# Patient Record
Sex: Female | Born: 1990 | Race: Black or African American | Hispanic: No | Marital: Single | Smoking: Never smoker
Health system: Southern US, Community
[De-identification: ages and names within clinical notes are randomized; demographics above are authoritative.]

## PROBLEM LIST (undated history)

## (undated) HISTORY — PX: INDUCED ABORTION: SHX677

---
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
## 2010-05-19 LAB — URINALYSIS, ROUTINE W REFLEX MICROSCOPIC
Bilirubin Urine: NEGATIVE
Hgb urine dipstick: NEGATIVE
Specific Gravity, Urine: 1.02 (ref 1.005–1.030)
Urobilinogen, UA: 0.2 mg/dL (ref 0.0–1.0)

## 2010-05-19 LAB — URINE CULTURE

## 2010-05-19 LAB — URINE MICROSCOPIC-ADD ON

## 2013-08-04 ENCOUNTER — Emergency Department: Payer: Self-pay | Admitting: Emergency Medicine

## 2015-08-02 ENCOUNTER — Encounter (HOSPITAL_COMMUNITY): Payer: Self-pay | Admitting: *Deleted

## 2015-08-02 ENCOUNTER — Emergency Department (HOSPITAL_COMMUNITY)
Admission: EM | Admit: 2015-08-02 | Discharge: 2015-08-03 | Disposition: A | Discharge status: Home / Self Care | Payer: Self-pay | Attending: Emergency Medicine | Admitting: Emergency Medicine

## 2015-08-02 DIAGNOSIS — N939 Abnormal uterine and vaginal bleeding, unspecified: Secondary | ICD-10-CM | POA: Insufficient documentation

## 2015-08-02 DIAGNOSIS — Z87828 Personal history of other (healed) physical injury and trauma: Secondary | ICD-10-CM | POA: Insufficient documentation

## 2015-08-02 DIAGNOSIS — Z3202 Encounter for pregnancy test, result negative: Secondary | ICD-10-CM | POA: Insufficient documentation

## 2015-08-02 DIAGNOSIS — R109 Unspecified abdominal pain: Secondary | ICD-10-CM

## 2015-08-02 LAB — BASIC METABOLIC PANEL
ANION GAP: 7 (ref 5–15)
BUN: 8 mg/dL (ref 6–20)
CO2: 25 mmol/L (ref 22–32)
Calcium: 9.3 mg/dL (ref 8.9–10.3)
Chloride: 104 mmol/L (ref 101–111)
Creatinine, Ser: 0.95 mg/dL (ref 0.44–1.00)
GFR calc Af Amer: >60 mL/min (ref >60)
Glucose, Bld: 118 mg/dL — ABNORMAL HIGH (ref 65–99)
POTASSIUM: 3.4 mmol/L — AB (ref 3.5–5.1)
SODIUM: 136 mmol/L (ref 135–145)

## 2015-08-02 LAB — CBC WITH DIFFERENTIAL/PLATELET
BASOS ABS: 0 10*3/uL (ref 0.0–0.1)
Basophils Relative: 0 %
EOS ABS: 0.1 10*3/uL (ref 0.0–0.7)
EOS PCT: 2 %
HCT: 38.3 % (ref 36.0–46.0)
HEMOGLOBIN: 12.3 g/dL (ref 12.0–15.0)
LYMPHS PCT: 35 %
Lymphs Abs: 1.9 10*3/uL (ref 0.7–4.0)
MCH: 27.9 pg (ref 26.0–34.0)
MCHC: 32.1 g/dL (ref 30.0–36.0)
MCV: 86.8 fL (ref 78.0–100.0)
Monocytes Absolute: 0.4 10*3/uL (ref 0.1–1.0)
Monocytes Relative: 8 %
NEUTROS PCT: 55 %
Neutro Abs: 3 10*3/uL (ref 1.7–7.7)
PLATELETS: 354 10*3/uL (ref 150–400)
RBC: 4.41 MIL/uL (ref 3.87–5.11)
RDW: 12.5 % (ref 11.5–15.5)
WBC: 5.5 10*3/uL (ref 4.0–10.5)

## 2015-08-02 LAB — I-STAT BETA HCG BLOOD, ED (MC, WL, AP ONLY)

## 2015-08-02 NOTE — ED Notes (Signed)
Patient presents stating had normal period 5/11-5/15 and then started again 5/25-5/30  Denies abd cramping at this time

## 2015-08-03 LAB — URINALYSIS, ROUTINE W REFLEX MICROSCOPIC
Bilirubin Urine: NEGATIVE
Glucose, UA: NEGATIVE mg/dL
HGB URINE DIPSTICK: NEGATIVE
Ketones, ur: NEGATIVE mg/dL
LEUKOCYTES UA: NEGATIVE
Nitrite: NEGATIVE
PROTEIN: NEGATIVE mg/dL
SPECIFIC GRAVITY, URINE: 1.011 (ref 1.005–1.030)
pH: 7 (ref 5.0–8.0)

## 2015-08-03 NOTE — Discharge Instructions (Signed)
Your labs today are reassuring.  You may take 600 mg ibuprofen for your abdominal pain.  Please call and follow up with [facility].  Please also follow up with your primary care doctor.  Return to the ED if you experience severe vaginal bleeding, abnormal vaginal discharge, or any new worsening symptoms.    Abnormal Uterine Bleeding Abnormal uterine bleeding can affect women at various stages in life, including teenagers, women in their reproductive years, pregnant women, and women who have reached menopause. Several kinds of uterine bleeding are considered abnormal, including:  Bleeding or spotting between periods.   Bleeding after sexual intercourse.   Bleeding that is heavier or more than normal.   Periods that last longer than usual.  Bleeding after menopause.  Many cases of abnormal uterine bleeding are minor and simple to treat, while others are more serious. Any type of abnormal bleeding should be evaluated by your health care provider. Treatment will depend on the cause of the bleeding. HOME CARE INSTRUCTIONS Monitor your condition for any changes. The following actions may help to alleviate any discomfort you are experiencing:  Avoid the use of tampons and douches as directed by your health care provider.  Change your pads frequently. You should get regular pelvic exams and Pap tests. Keep all follow-up appointments for diagnostic tests as directed by your health care provider.  SEEK MEDICAL CARE IF:   Your bleeding lasts more than 1 week.   You feel dizzy at times.  SEEK IMMEDIATE MEDICAL CARE IF:   You pass out.   You are changing pads every 15 to 30 minutes.   You have abdominal pain.  You have a fever.   You become sweaty or weak.   You are passing large blood clots from the vagina.   You start to feel nauseous and vomit. MAKE SURE YOU:   Understand these instructions.  Will watch your condition.  Will get help right away if you are not  doing well or get worse.   This information is not intended to replace advice given to you by your health care provider. Make sure you discuss any questions you have with your health care provider.   Document Released: 02/18/2005 Document Revised: 02/23/2013 Document Reviewed: 09/17/2012 Elsevier Interactive Patient Education [employer]2016 Elsevier Inc.

## 2015-08-03 NOTE — ED Provider Notes (Signed)
CSN: [id number]     Arrival date & time 08/02/15  2213 History   First MD Initiated Contact with Patient 08/03/15 0000     Chief Complaint  Patient presents with  . Abdominal Cramping     (Consider location/radiation/quality/duration/timing/severity/associated sxs/prior Treatment) HPI Tami Wells is a G43P0A1 25 y.o. female with no significant PMH who presents with now resolved sudden onset, moderate suprapubic abdominal cramping followed by vaginal bleeding.  LNMP 07/13/15--07/17/15.  07/25/15 (1 week ago) patient reports that she jumped down into a hole at work and hit her lower abdomen that was then followed by vaginal spotting that turned into what seemed like another menstrual cycle.  Bleeding ended yesterday.  She still reports mild suprapubic abdominal pain.  She did not take anything for her symptoms.  Denies vaginal discharge, current vaginal bleeding, urinary symptoms, N/V/D.  She states her menstrual cycle is very regular.  She does report she went for her annual physical 07/25/15 and a pap smear was performed.  Per chart review, patient had negative HIV, RPR, trich, gonorrhea, and chlamydia.  She reports she has not been sexually active since her annual physical.   History reviewed. No pertinent past medical history. Past Surgical History  Procedure Laterality Date  . Induced abortion     No family history on file. Social History  Substance Use Topics  . Smoking status: Never Smoker   . Smokeless tobacco: Never Used  . Alcohol Use: Yes     Comment: ocassionally   OB History    No data available     Review of Systems All other systems negative unless otherwise stated in HPI    Allergies  Review of patient's allergies indicates no known allergies.  Home Medications   Prior to Admission medications   Not on File   BP 123/75 mmHg  Pulse 72  Temp(Src) 98.1 F (36.7 C) (Oral)  Resp 18  Ht 5' (1.524 m)  Wt 74.844 kg  BMI 32.22 kg/m2  SpO2 100%  LMP  07/27/2015 Physical Exam  Constitutional: She is oriented to person, place, and time. She appears well-developed and well-nourished.  Non-toxic appearance. She does not have a sickly appearance. She does not appear ill.  HENT:  Head: Normocephalic and atraumatic.  Mouth/Throat: Oropharynx is clear and moist.  Eyes: Conjunctivae are normal.  Neck: Normal range of motion. Neck supple.  Cardiovascular: Normal rate and regular rhythm.   Pulmonary/Chest: Effort normal and breath sounds normal. No accessory muscle usage or stridor. No respiratory distress. She has no wheezes. She has no rhonchi. She has no rales.  Abdominal: Soft. Bowel sounds are normal. She exhibits no distension. There is no tenderness. There is no rebound and no guarding.  Genitourinary: Vagina normal and uterus normal. There is no tenderness or lesion on the right labia. There is no tenderness or lesion on the left labia. Cervix exhibits no motion tenderness and no discharge. Right adnexum displays no mass and no tenderness. Left adnexum displays no mass and no tenderness.  Musculoskeletal: Normal range of motion.  Lymphadenopathy:    She has no cervical adenopathy.  Neurological: She is alert and oriented to person, place, and time.  Speech clear without dysarthria.  Skin: Skin is warm and dry.  Psychiatric: She has a normal mood and affect. Her behavior is normal.    ED Course  Procedures (including critical care time) Labs Review Labs Reviewed  BASIC METABOLIC PANEL - Abnormal; Notable for the following:    Potassium 3.4 (*)  Glucose, Bld 118 (*)    All other components within normal limits  CBC WITH DIFFERENTIAL/PLATELET  URINALYSIS, ROUTINE W REFLEX MICROSCOPIC (NOT AT [facility])  I-STAT BETA HCG BLOOD, ED ([facility], [facility], [facility] ONLY)    Imaging Review No results found. I have personally reviewed and evaluated these images and lab results as part of my medical decision-making.   EKG Interpretation None      MDM    Final diagnoses:  Vaginal bleeding  Abdominal cramping   Patient presents with now resolved suprapubic abdominal cramping and vaginal bleeding.  VSS, NAD.  On exam, patient appears well, non-toxic or septic appearing.  Heart RRR, lungs CTAB, abdomen soft and benign.  Cervix without discharge.  No CMT or adnexal tenderness.  Labs reassuring.  Recent negative HIV, RPR, GC/Chlamydia; therefore, not obtained today.  Doubt ectopic, PID, TOA, torsion, or cervicitis. Plan to follow up with PCP.  [facility].  Discussed return precautions.  Patient agrees and acknowledges the above plan for discharge.   Case has been discussed with Dr. Patria Maneampos who agrees with the above plan for discharge.      Cheri FowlerKayla Kavari Parrillo, PA-C 08/03/15 0123  Azalia BilisKevin Campos, MD 08/03/15 0130

## 2015-08-03 NOTE — ED Notes (Signed)
Pelvic cart at the bedside 
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
## 2023-03-05 NOTE — L&D Delivery Note (Signed)
-------------------------------------------------------------------------------   Attestation signed by Eleanor JULIANNA Poisson, MD at 12/03/23 [phone] I have reviewed the delivery note and confirm it is accurate. I was immediately available but not called to assist with delivery. Eleanor Poisson, MD  Obstetrics & Gynecology [facility]       -------------------------------------------------------------------------------  [facility] Delivery Note: Vaginal   Review the Delivery Report for details. <redacted file path>   Tami Wells is a 32 y.o., now G2P1011 with an estimated date of delivery of 12/05/23, who delivered at [redacted]w[redacted]d gestation.      Membrane rupture occurred at 8:58 PM on 12/02/2023, and the amniotic fluid was clear.  With combined spinal epidural anesthesia, the patient's labor was induced with OXYTOCIN;AROM     Delivery Procedure   Labor progressed, the fetal head descended, and was carefully delivered from a occiput anterior position. With additional maternal expulsive effort, the shoulders and remainder of the fetus were atraumatically delivered.  A controlled vaginal delivery was completed at 5:01 AM on 12/03/2023. The baby was a living, 3.48 kg (7 lb 10.8 oz), female infant, with APGAR scores of 8 at 1 min / 9 at 5 min, delivered by Vaginal, Spontaneous from an occiput anterior position. 1loop(s) of nuchal cord was identified (likely present at the time of admission) and found to be loose around the neck and was reduced.     After an appropriate delay, the cord was doubly clamped and cut. The umbilical cord was found to have 3 vessels. Skin to Skin was initiated at 5:02 AM. The placenta was spontaneousand appeared intact. Pitocin was given immediately after delivery. Fundal massage was performed, and the fundus was firm.  Episiotomy was not done. Perineum, vagina, labia, and cervix were inspected and found to have:  Perineal laceration 1st . Repaired  No.   Hemostasis was confirmed on final inspection. Final sponge, needle, and instrument counts were reported and correct.   Delivery Complications: None    Total estimated blood loss (mL): 100 Total quantitative blood loss (mL):    The patient tolerated the delivery well and is in good and stable condition.  Nanetta App SNM assisted with delivery; Maura Macadam CNM present at delivery.  Notified Dr Poisson of delivery.  Electronically Signed: Maura Macadam, CNM 12/03/2023 / 6:57 AM

## 2023-12-02 NOTE — Progress Notes (Signed)
 IUP @ 39.[redacted] weeks gestation IOL Re: BMI.40 SVE: 6.5/75/v-1 by RN; patient declines AROM at this time UC's q 2-4 min that palpate moderate G2 P0010  GBS positive, Amp x 2 doses Cat I FHR tracing Maternal fetal/status stable Updated Dr Gretel

## 2023-12-02 NOTE — H&P (Signed)
 ------------------------------------------------------------------------------- Attestation signed by Tami Mayotte, MD at 12/02/23 [phone] I agree with documentation below. Patient is [redacted]w[redacted]d by 10-week ultrasound. Pregnancy has been complicated by maternal obesity, prediabetes, uterine fibroids. Patient failed 1-hour glucose challenge and did not complete a 3-hour test. She has followed with Nutrition. Plan to check POC glucose intrapartum to ensure at goal. GBS is positive, and ampicillin is ordered for prophylaxis. Fetal heart tracing is category 1. Cervix is favorable. Proceed with pitocin augmentation.   Tami Mayotte, MD  -------------------------------------------------------------------------------  OBSTETRICS HISTORY AND PHYSICAL  CHIEF COMPLAINT: Induction of labor  DIAGNOSIS: 1. IUP at [redacted]w[redacted]d 2. BMI 49 3. Prediabetes 4. GBS + 5. Uterine Fibroids   ASSESSMENT/PLAN   33 y.o. G2P0010 at [redacted]w[redacted]d dated by LMP c/w 10 week US  with a pregnancy complicated by:  #. mIOL - SVE 4/50/-3 by RN - Membranes intact - Will start induction with pitocin as needed and AROM when appropriate - Vertex confirmed by SVE - EFW 6.5lbs by Leopold's; last US  EFW: 2707g (5#15) 46% AC 47% on 9/8 @ 36 wks - GBS positive, for Ampicillin - R/B/A of vaginal and cesarean delivery explained including bleeding, vaginal lacerations, infection, and damage to surrounding structures such as bladder and perineum. Patient verbalized understanding and all questions answered. Consents signed.  #. BMI 49 - Followed by MFM - Last fetal growth as above  #. Prediabetes - Followed by nutrition - Abnormal 1 hr gtt/ no 3 hr gtt - Checking FSBS at home in lieu of 3 hr gtt - FSBS Q4 hr in labor  #. Uterine Fibroids - Fundal pedunculated 1.4x1.4x1.9cm - Fundal Intramural 1.0 x 0.9 x 1.1  #. Fetal Wellbeing  - Fetal tracing: Category 1 - BMZ: N/a - NICU consult: N/a  #. Routine Obstetrics - Labs reviewed, as below - MBT:  B - Vaccines: see problem list - DVT PPx: ambulatory  #. Postpartum Planning: - Infant feeding: discuss PP  - Contraception: discuss PP   Dispo: admit to L&D - Delivery Plan: anticipate SVD -Collaborate with MD PRN - MD Wells aware of admission and POC  Induction augmentation plan: Medically indicated induction of labor with Artificial rupture of membranes, Misoprostol (Cytotec) cervical ripening, and Oxytocin (Pitocin)  Diagnoses present on admission include: Group B Strep positive  Fetal Presentation / Lie: Cephalic/Vertex determined by digital vaginal examination.  Fetal complications include: No fetal complications present on admission.  Plan: Monitor fetal well-being and intervene as clinically indicated  Maternal comorbidities and complicating characteristics include:  Cardiovascular: No maternal blood pressure or cardiovascular diagnosis present on admission.  Plan: Monitor and treat as indicated.  Endocrine: Prediabetes present on admission.  Plan: Monitor and treat as indicated.  Hematologic: No maternal hematologic / blood diagnosis present on admission.  Plan: Monitor and treat as indicated.  Hepatic / Gastrointestinal:  No hepatic or gastrointestinal diagnosis present on admission.  Plan: Monitor and treat as indicated.  Infectious Disease: No maternal infectious disease diagnosis present on admission.  Plan: Monitor and treat as indicated.  Behavioral Health: No maternal behavioral health diagnosis present on admission.  Plan: Monitor and treat as indicated.  Additional Maternal complicating characteristics: Obesity (BMI 35 or higher) present on admission. Plan: Monitor and address as appropriate.     HISTORY OF PRESENT ILLNESS   33 y.o. G23P0010 female at [redacted]w[redacted]d dated by LMP c/w 10 week scan who presents for induction of labor.  OB Review of Systems: Fetal movement: active Vaginal bleeding: no Loss of fluid: no Contractions: no  Preeclampsia  Symptoms Review: Headaches: no Vision changes:no Chest pain and/or shortness of breath: no  RUQ pain: no  ROS otherwise negative.   HISTORY: OB History  Gravida Para Term Preterm AB Living  2    1   SAB IAB Ectopic Multiple Live Births  0 1       # Outcome Date GA Lbr Len/2nd Weight Sex Type Anes PTL Lv  2 Current           1 IAB              History reviewed. No pertinent past medical history.   Past Surgical History:  Procedure Laterality Date  . No past surgeries       Short Social History[1]   Family History  Problem Relation Age of Onset  . No Known Problems Paternal Grandfather   . Hypertension Paternal Grandmother   . No Known Problems Maternal Grandmother   . No Known Problems Maternal Grandfather   . Hypertension Father   . Diabetes Father   . Liver disease Father        dialysis  . No Known Problems Mother   . No Known Problems Brother   . No Known Problems Sister   . Diabetes Maternal Aunt   . Hypertension Paternal Aunt   . Breast cancer Neg Hx   . Colon cancer Neg Hx   . Ovarian cancer Neg Hx   . Stroke Neg Hx   . Thalassemia Neg Hx   . Sickle cell anemia Neg Hx   . Thrombophilia Neg Hx   . Hemophilia Neg Hx   . Miscarriages / Stillbirths Neg Hx   . Pre-eclampsia Neg Hx   . Preterm labor Neg Hx   . Down syndrome Neg Hx   . Congenital heart disease Neg Hx   . Cystic fibrosis Neg Hx   . Spina bifida Neg Hx   . Autism Neg Hx   . Anencephaly Neg Hx   . Muscular dystrophy Neg Hx   . PKU Neg Hx   . Canavan disease Neg Hx   . Chromosomal disorder Neg Hx   . Familial dysautonomia Neg Hx   . Fragile X syndrome Neg Hx   . Huntington's disease Neg Hx   . Intellectual Disability  Neg Hx   . Birth defects Neg Hx   . Meningomyelocele Neg Hx       OBJECTIVE  VITALS: Temp:  [98 F (36.7 C)] 98 F (36.7 C) Heart Rate:  [85-99] 85 Resp:  [16] 16 BP: (128)/(70-80) 128/80 SpO2:  [95 %-99 %] 99 %  PHYSICAL EXAM: GENERAL: NAD,  alert CHEST: no increased work of breathing ABDOMEN: gravid, nontender, EFW 6.5lbs by Leopolds EXTREMITIES: WWP, nonedematous NEURO: AAO x 3 CERVIX: 4/50/-3 by RN SPECULUM: Not indicated  FHT:               MEDS: Current Home Medications  Medication Sig Last Dose  ACCU-CHEK SOFTCLIX LANCETS lancets Use 4 daily as instructed Past Month  aspirin (ECOTRIN LOW DOSE) EC tablet Take one tablet (81 mg dose) by mouth daily. Past Month  Blood Glucose Monitoring Suppl (ACCU-CHEK GUIDE) w/Device KIT Use as directed by physician to monitor blood sugars Past Month  glucose blood test strip Use to accu-chek guide test strips to monitor blood glucose 4 times daily Past Month  Prenatal Vit-Fe Fumarate-FA (PRENATAL VITAMIN PLUS LOW IRON) 27-1 MG TABS Take 1 tablet by mouth daily. Take 1 tablet daily. May take with  food if upset stomach. 12/01/2023    Allergies[2]   DIAGNOSTIC STUDIES: No results for input(s): WBC, HGB, HCT, PLT, NA, K, CL, CO2, BUN, CREATININE, GLUCOSE, CALCIUM, AST, ALT, UA in the last 168 hours.  Invalid input(s): LABALB, MG  Recent Results (from the past 24 hours)  POCT UA VISUAL 2 OB   Collection Time: 12/01/23 11:05 AM  Result Value Ref Range   Protein Trace (A) Negative mg/dL   Glucose,Urine Negative Negative mg/dL    Prenatal Labs: ABO Grouping (no units)  Date Value  05/26/2023 B   Rh Factor (no units)  Date Value  05/26/2023 Positive   Antibody Screen (no units)  Date Value  05/26/2023 Negative        Hemoglobin (g/dL)  Date Value  93/80/7974 12.6  05/26/2023 12.9   HGB (g/dL)  Date Value  93/97/7976 12.0 (A)   Hematocrit (%)  Date Value  08/21/2023 39.7  05/26/2023 38.5  08/03/2021 36.1   Protein (mg/dL)  Date Value  90/70/7974 Trace (A)  07/22/2023 Negative        Strep Gp B NAA (no units)  Date Value  11/10/2023 Positive (A)          Neisseria Gonorrhoeae, NAA (no units)  Date Value   05/26/2023 Negative   Neisseria Gonorrhoeae By PCR (no units)  Date Value  08/03/2021 Negative  08/06/2016 Negative   Chlamydia Trachomatis, NAA (no units)  Date Value  05/26/2023 Negative  08/03/2021 Negative  08/06/2016 Negative   RPR (no units)  Date Value  08/21/2023 Non Reactive  05/26/2023 Non Reactive  08/03/2021 Non Reactive        Rubella Ab, IgG (index)  Date Value  05/26/2023 2.76     Hepatitis B Surface Ag (no units)  Date Value  05/26/2023 Negative   HIV Ab/p24 Ag Screen (no units)  Date Value  08/21/2023 Non Reactive    No results found for: PPD TSH (uIU/mL)  Date Value  08/03/2021 2.240   HM Pap smear (no units)  Date Value  10/02/2022 Abnormal (A)        No results found for: CF No results found for: CANAVAN No components found for: BETATHALASSEMIAHB  No results found for: SICKLE  No results found for: [employer]. Rolene, CNM Obstetrics & Gynecology [facility] 12/02/2023 6:44 AM         [1] Social History Tobacco Use  . Smoking status: Never    Passive exposure: Never  . Smokeless tobacco: Never  Vaping Use  . Vaping status: Never Used  Substance Use Topics  . Alcohol use: Not Currently    Comment: 3 glasses of wine per month//none in pregnancy  . Drug use: No  [2] No Known Allergies

## 2023-12-02 NOTE — Progress Notes (Signed)
 IUP @ 39.[redacted] weeks gestation IOL Re: BMI.40 SVE: 6.5/80/v-1  AROM with clear fluid noted and no meconium  UC's q 2-4 min that palpate moderate Discussed pain management options at this time G2 P0010  GBS positive, Amp x 2 doses Cat I FHR tracing Maternal fetal/status stable Updated Dr Gretel

## 2023-12-02 NOTE — Progress Notes (Signed)
 OB Progress Note  Care assumed at 0700, patient met with CNM on morning rounds.  Tami Wells is a 33 y.o. G2P0010 at [redacted]w[redacted]d admitted for induction of labor. Pregnancy complicated by obesity, uterine fibroids, GBS positive, and treating as A1GDM. Of note, patient declines blood transfusion. States that she declines all blood products, even in a life threatening situation. Has band and will sign forms.   Fibroids noted on D&V ultrasound 05/09/23 1. Fundal Pedunculated: 1.4 x 1.4 x 1.9cm  2. Fundal Intramural: 1.0 x 0.9 x 1.1cm   SCE 4/50/-3 on admit FHT cat I Plan induction/augmentation with pitocin, and ampicillin for GBS positive status  Continue to monitor POC glucose per protocol  Continue to monitor closely.   Eleanor Poisson, MD  Obstetrics & Gynecology [facility]

## 2023-12-03 NOTE — Lactation Note (Signed)
(  [id number])--Un room for initial contact. Baby was born this morning at 0501 and he nursed in LDR. Mom getting to offer the breast when I arrived at the bedside. Mom offering the R  breast in a cradle hold but it seemed awkward for her and baby. I recommended a clutch hold and mom receptive. I placed pillow support for a clutch hold and assisted mom to offer the R breast. (+) colostrum noted. Baby not interested in latching, so mom placed him skin to skin. After a few minutes, he began rooting again and mom offered the R breast in a clutch hold. I showed mom how to sandwich the breast, support baby's head and bring him in to the breast quickly for a deep initial latch. I encouraged not to pull the breast away from baby's nose to minimize nipple tenderness. Discussed typical newborn feeding patterns and encouraged parents to nap today when they can. Discussed avoiding bottles/pacifiers for the first 3-4 weeks of life as ideal. Encouraged to call for [facility] support prn. Baby still nursing when I left the room. Updated their RN, Jearld.

## 2023-12-03 NOTE — Progress Notes (Signed)
 OB Progress Note  Feeling some discomfort with contractions but overall comfortable with epidural.  SCE 10/100/+1 Pitocin at 12 mu/min FHT cat I, toco every 2 minutes   Will start pushing after straight catheterization to empty bladder Tami Poisson, MD  Obstetrics & Gynecology [facility]

## 2023-12-04 NOTE — Lactation Note (Signed)
 [id number]  Follow up lactation consult at bedside. Mom reports recent feeding at breast and then supplemented with formula via bottle before baby went for circumcision.  LC discussed supply and demand at breast and encouraged delay of artificial nipples. Mom reports she is planning to pump and bottle feed at home. LC assisted mom with set up of personal spectra  pump with few drops expressed.   Lc encouraged pumping 8+x/day and mom is aware of lactation resources as needed.

## 2023-12-04 NOTE — Discharge Summary (Signed)
 [facility]   Obstetrics Discharge Note  Discharge Details   Admit date:         12/02/2023 Discharge date:         12/04/2023  Hospital Days:    2 days  Active Hospital Problems   Diagnosis Date Noted POA  . *Encounter for induction of labor 12/02/2023 Not Applicable  . Single live birth (*) 12/03/2023 Not Applicable    Resolved Hospital Problems  No resolved problems to display.      Current Discharge Medication List    You have not been prescribed any medications.     Assessment/Plan   Active Hospital Problems   Diagnosis Date Noted POA  . *Encounter for induction of labor 12/02/2023 Not Applicable  . Single live birth (*) 12/03/2023 Not Applicable    Resolved Hospital Problems  No resolved problems to display.    Physicians involved in care during this hospitalization Attending Provider: Rosealee Mayotte, MD Attending Provider: Eleanor JULIANNA Poisson, MD Attending Provider: Dena JONELLE Guadeloupe, MD Admitting Provider: Rosealee Mayotte, MD Anesthesiologist: Belvie JINNY Breen, MD Bedside Procedures   No orders found       Condition at Discharge: Stable  S: Lynleigh is doing well. She reports pain is well-controlled with PO meds and bleeding is light without clots. She is breastfeeding and states it is going well. She is able to ambulate independently to the bathroom and voids without difficulty. She reports having a BM without difficulty. She denies lightheadedness/dizziness, HA, vision changes, RUQ pain, chest pain, dyspnea, or pain/swelling in the back of either calf. She states that emotionally she is feeling well and happy. She desires discharge to home today. She plans to use nothing for PP contraception.   O: See flowsheet  A/P: # 33 y.o., [redacted]w[redacted]d s/p SVD PP day 1 - Reassuring maternal status, pain well-controlled, VSS  - Continue standard postpartum orders. Encouraged regular ambulation, good nutrition, and hydration  - PP Hgb 12.0 - EPDS 0/30, 0 on #10   - Rx sent for ibuprofen - Discharge instructions reviewed with pt including warning S/S to report to provider. Nothing per vaginal for 6 wks. Pt educated to go to ED if she develops SI/HI. - F/u 1 wk for BP check, sooner PRN    # Prenatal labs - Rh positive - Rubella immune - Varicella unknown   # GHTN - Intermittent mild range BP's following admission to L&D - Asymptomatic, labs WNL - Discussed warning S/S of preeclampsia to report to pt - Referral placed for RPM program - Recommended 1 wk BP check   # BMI 49 - Encouraged regular ambulation - F/u PRN    # Fibroid - F/u PRN   # Prediabetes - Encourage low carb diet - F/u PRN   # Lactating - Lactation services PRN - Reviewed warning S/S of complications to report to provider    - Dispo: Discharge to home    Contact information for follow-up     [facility]   [address] [place] [address]  Phone: [phone]     Next Steps: Follow up in 1 week(s)   Instructions: Follow up in 1 week for BP check, sooner PRN      Appointments which have been scheduled    Dec 05, 2023 11:30 AM Routine Prenatal Visit with Darryle Lucien Hinds, MD [facility]) (--) [address] [place] [address] [phone]   Dec 08, 2023 9:30 AM Non-stress test  with WC OBGYN NST FRONT [facility]) (--) [address] [place] [address] [phone]   Dec 08, 2023 10:00 AM Ultrasound OB Pelvic with WC OBGYN SONO 2 [facility]) (--) [address] [place]. [place] [address] [phone]   Dec 08, 2023 10:30 AM Routine Prenatal Visit with Fairy KANDICE Fire, MD [facility]) (--) [address] [place] [address] [phone]        Interval History  History of Present Illness Tami Wells is a 33 y.o. G2P1011 who was admitted for IOL. She is a  Teacher, adult education patient with adequate PNC notable for fibroid, prediabetes, BMI >40, and elevated 1hr GTT. She was GBS positive. She did meet criteria for [facility] following admission to L&D with intermittent mild range BP's with no symptoms and normal labs. She had a NSVD with a first degree perineal laceration and an EBL of . Her PP course has been remarkable for GHTN. She has met all her PP milestones. Pt is stable for discharge.   MBT: B+ RI Hgb: 12.0 BC: None   Medications:  . docusate sodium  100 mg Oral BID  . ibuprofen  600 mg Oral Q6H  . prenatal multivitamin  1 tablet Oral Daily    Physical Examination  Temp:  [97.5 F (36.4 C)-98.3 F (36.8 C)] 97.5 F (36.4 C) Heart Rate:  [70-92] 70 Resp:  [18-19] 18 BP: (122-147)/(80-92) 138/84 SpO2:  [99 %] 99 %      No intake or output data in the 24 hours ending 12/04/23 0700  Physical Exam Vitals and nursing note reviewed.  Constitutional:      General: She is not in acute distress.    Appearance: Normal appearance. She is normal weight.  HENT:     Head: Normocephalic and atraumatic.     Nose: Nose normal.     Mouth/Throat:     Mouth: Mucous membranes are moist.  Pulmonary:     Effort: Pulmonary effort is normal. No respiratory distress.  Abdominal:     General: There is no distension.     Palpations: Abdomen is soft.     Comments: FF @ -1 below U  Genitourinary:    Comments: Lochia scant Laceration well-approximated Vulvar edema absent Skin:    General: Skin is warm and dry.  Neurological:     General: No focal deficit present.     Mental Status: She is alert and oriented to person, place, and time.     Deep Tendon Reflexes: Reflexes normal.     Comments: Negative clonus   Psychiatric:        Mood and Affect: Mood normal.        Behavior: Behavior normal.        Thought Content: Thought content normal.        Judgment: Judgment normal.    Delivery: Date of Birth: 12/03/2023  Time of Birth: 5:01 AM    Information for the patient's newborn:  Kortny, Lirette [[id number]]  female Birth History  . Birth    Weight: 3.48 kg (7 lb 10.8 oz)  . Apgar    One: 8    Five: 9  . Delivery Method: Vaginal, Spontaneous  . Gestation Age: 71 5/7 wks  . Duration of Labor: 1st: 11h 37m / 2nd: 1h 2m  . Hospital Name: [facility]  . Hospital Location: [place], [place]    Results  Labs:  Recent Results (from the past 24  hours)  CBC   Collection Time: 12/04/23  8:04 AM  Result Value Ref Range   WBC 10.2 4.0 - 10.5 thou/mcL   RBC 4.36 3.93 - 5.22 million/mcL   HGB 12.0 11.2 - 15.7 gm/dL   HCT 62.2 65.8 - 55.0 %   MCV 86.5 79.4 - 94.8 fL   MCH 27.5 25.6 - 32.2 pg   MCHC 31.8 (L) 32.2 - 35.5 gm/dL   Plt Ct 775 849 - 599 thou/mcL   RDW SD 44.3 35.1 - 46.3 fL   MPV 10.6 9.4 - 12.4 fL   NRBC% 0.0 0.0 - 0.2 /100WBC   Absolute NRBC Count 0.00 0.00 - 0.01 thou/mcL   Unresulted Labs     Order Current Status   Comprehensive Metabolic Panel In process       Electronically signed: Lionel Hess, CNM 12/04/2023 / 8:33 AM

## 2023-12-07 ENCOUNTER — Other Ambulatory Visit: Payer: Self-pay

## 2023-12-07 ENCOUNTER — Emergency Department (HOSPITAL_COMMUNITY)

## 2023-12-07 ENCOUNTER — Encounter (HOSPITAL_COMMUNITY): Payer: Self-pay

## 2023-12-07 ENCOUNTER — Emergency Department (HOSPITAL_COMMUNITY)
Admission: EM | Admit: 2023-12-07 | Discharge: 2023-12-07 | Disposition: A | Discharge status: Home / Self Care | Attending: Emergency Medicine | Admitting: Emergency Medicine

## 2023-12-07 DIAGNOSIS — R339 Retention of urine, unspecified: Secondary | ICD-10-CM | POA: Insufficient documentation

## 2023-12-07 DIAGNOSIS — M79662 Pain in left lower leg: Secondary | ICD-10-CM | POA: Insufficient documentation

## 2023-12-07 DIAGNOSIS — R600 Localized edema: Secondary | ICD-10-CM | POA: Diagnosis not present

## 2023-12-07 LAB — COMPREHENSIVE METABOLIC PANEL WITH GFR
ALT: 21 U/L (ref 0–44)
AST: 31 U/L (ref 15–41)
Albumin: 3.3 g/dL — ABNORMAL LOW (ref 3.5–5.0)
Alkaline Phosphatase: 145 U/L — ABNORMAL HIGH (ref 38–126)
Anion gap: 11 (ref 5–15)
BUN: 7 mg/dL (ref 6–20)
CO2: 21 mmol/L — ABNORMAL LOW (ref 22–32)
Calcium: 8.7 mg/dL — ABNORMAL LOW (ref 8.9–10.3)
Chloride: 109 mmol/L (ref 98–111)
Creatinine, Ser: 0.7 mg/dL (ref 0.44–1.00)
GFR, Estimated: >60 mL/min (ref >60)
Glucose, Bld: 97 mg/dL (ref 70–99)
Potassium: 3.8 mmol/L (ref 3.5–5.1)
Sodium: 140 mmol/L (ref 135–145)
Total Bilirubin: 0.4 mg/dL (ref 0.0–1.2)
Total Protein: 6.1 g/dL — ABNORMAL LOW (ref 6.5–8.1)

## 2023-12-07 LAB — PROTEIN / CREATININE RATIO, URINE
Creatinine, Urine: 232 mg/dL
Protein Creatinine Ratio: 0.12 mg/mg{creat} (ref 0.00–0.15)
Total Protein, Urine: 28 mg/dL

## 2023-12-07 LAB — URINALYSIS, ROUTINE W REFLEX MICROSCOPIC
Bacteria, UA: NONE SEEN
Bilirubin Urine: NEGATIVE
Glucose, UA: NEGATIVE mg/dL
Hgb urine dipstick: NEGATIVE
Ketones, ur: NEGATIVE mg/dL
Leukocytes,Ua: NEGATIVE
Nitrite: NEGATIVE
Protein, ur: 30 mg/dL — AB
Specific Gravity, Urine: 1.02 (ref 1.005–1.030)
pH: 5 (ref 5.0–8.0)

## 2023-12-07 LAB — CBC WITH DIFFERENTIAL/PLATELET
Abs Immature Granulocytes: 0.1 K/uL — ABNORMAL HIGH (ref 0.00–0.07)
Basophils Absolute: 0.1 K/uL (ref 0.0–0.1)
Basophils Relative: 1 %
Eosinophils Absolute: 0.3 K/uL (ref 0.0–0.5)
Eosinophils Relative: 5 %
HCT: 39.8 % (ref 36.0–46.0)
Hemoglobin: 12.6 g/dL (ref 12.0–15.0)
Immature Granulocytes: 2 %
Lymphocytes Relative: 28 %
Lymphs Abs: 1.8 K/uL (ref 0.7–4.0)
MCH: 27.9 pg (ref 26.0–34.0)
MCHC: 31.7 g/dL (ref 30.0–36.0)
MCV: 88.1 fL (ref 80.0–100.0)
Monocytes Absolute: 0.5 K/uL (ref 0.1–1.0)
Monocytes Relative: 8 %
Neutro Abs: 3.6 K/uL (ref 1.7–7.7)
Neutrophils Relative %: 56 %
Platelets: 262 K/uL (ref 150–400)
RBC: 4.52 MIL/uL (ref 3.87–5.11)
RDW: 13.8 % (ref 11.5–15.5)
WBC: 6.4 K/uL (ref 4.0–10.5)
nRBC: 0 % (ref 0.0–0.2)

## 2023-12-07 MED ORDER — POTASSIUM CHLORIDE CRYS ER 20 MEQ PO TBCR: 20.0000 meq | EXTENDED_RELEASE_TABLET | Freq: Every day | ORAL | 0 refills | Status: AC

## 2023-12-07 MED ORDER — FUROSEMIDE 20 MG PO TABS
20.0000 mg | ORAL_TABLET | Freq: Every day | ORAL | 0 refills | Status: AC
Start: 2023-12-07 — End: 2023-12-12

## 2023-12-07 NOTE — Progress Notes (Signed)
 BLE venous duplex has been completed.  Preliminary results given to Caroline Aberman, PA-C.   Results can be found under chart review under CV PROC. 12/07/2023 11:57 AM Laquonda Welby RVT, RDMS

## 2023-12-07 NOTE — ED Provider Notes (Signed)
 Care assumed from [facility], PA-C at shift change pending ultrasound to rule out DVT.  See her note for full HPI.  Patient is a 33 year old female 4 days postpartum who presents to the ED due to lower extremity, hand, and facial swelling that started last night.  Denies headache or blurry vision.  No right upper quadrant pain.  Currently breast-feeding.  Denies any significant vaginal bleeding. Physical Exam  BP 109/76   Pulse 60   Temp 97.8 F (36.6 C) (Oral)   Resp 16   SpO2 100%   Breastfeeding Yes   Physical Exam Vitals and nursing note reviewed.  Constitutional:      General: She is not in acute distress.    Appearance: She is not ill-appearing.  HENT:     Head: Normocephalic.  Eyes:     Pupils: Pupils are equal, round, and reactive to light.  Cardiovascular:     Rate and Rhythm: Normal rate and regular rhythm.     Pulses: Normal pulses.     Heart sounds: Normal heart sounds. No murmur heard.    No friction rub. No gallop.  Pulmonary:     Effort: Pulmonary effort is normal.     Breath sounds: Normal breath sounds.  Abdominal:     General: Abdomen is flat. There is no distension.     Palpations: Abdomen is soft.     Tenderness: There is no abdominal tenderness. There is no guarding or rebound.  Musculoskeletal:        General: Normal range of motion.     Cervical back: Neck supple.     Comments: No pitting edema to lower extremities  Skin:    General: Skin is warm and dry.  Neurological:     General: No focal deficit present.     Mental Status: She is alert.  Psychiatric:        Mood and Affect: Mood normal.        Behavior: Behavior normal.     Procedures  Procedures  ED Course / MDM   Clinical Course as of 12/07/23 0657  [facility] Dec 07, 2023  0617 Consult to Dr. Abigail, OBGYN on call who recommends obtaining urine protein/Cr ratio. Can discharge if Urine protein/cr: <0.30. Recommends initiating lasix 20 mg daily x 5 days, and oral K 20 meq daily x 5  days. Follow up with OB on Monday for BP check.  [RS]  0621 I personally called lab and they will be adding on urine protein creatinine ratio. [RS]    Clinical Course User Index [RS] Sponseller, Pleasant SAUNDERS, PA-C   Medical Decision Making Amount and/or Complexity of Data Reviewed Labs: ordered. Decision-making details documented in ED Course.  Risk Prescription drug management.  Care assumed from [facility], PA-C at shift change pending ultrasound to rule out DVT.   33 year old female presents to the ED due to edema.  Patient currently 4 days postpartum.  Initially patient was hypertensive at 138/101 however, BP improved and normalized during her ED stay.  Denies any headache or visual changes.  No right upper quadrant pain.  Reviewed labs from previous provider.  Normal AST and ALT.  Mild elevation in alk phos at 145.  Normal total bilirubin.  CBC with no leukocytosis. UA does have some proteinuria. Protein/creatinine ratio normal.   7:53 AM reassessed patient at bedside.  Patient resting comfortably in bed.  Notes edema started around 9 to 10 PM last night.  Was given extra fluids during delivery 4 days  ago (patient believes roughly 4L).  No edema during pregnancy.  Denies chest pain and shortness of breath.  Routine/creatinine ratio normal. Awaiting DVT study.   10:27 AM reassessed patient.  Patient's BP has remained normal however, was elevated upon arrival.  No headache or blurry vision.  Does have some proteinuria however, protein/creatinine ratio normal.  Previous provider discussed with OB who recommends initiating Lasix 20 mg x 5 days and oral K 20meq x 5 days for edema.  Low suspicion for postpartum preeclampsia.  Patient denies any chest pain or shortness of breath.  Low suspicion for any cardiac etiology of symptoms.  Could possibly related to IV fluids given during delivery. Patient able to urinate without difficulty. No evidence of urinary retention. Advised patient to call OB  and be seen on Monday for a BP recheck.  Return for any worsening symptoms. Strict ED precautions discussed with patient. Patient states understanding and agrees to plan. Patient discharged home in no acute distress and stable vitals  11:41 AM awaiting preliminary results for DVT study however, vascular tech told me there is no evidence of DVT.  Patient has a 4-day newborn at bedside and has been here for 8.5 hours and is wanting to be discharged. Will discharge patient prior to preliminary read and call if positive.   12:03 PM US  negative for DVT.     Lorelle Aleck BROCKS, PA-C 12/07/23 1205    Freddi Hamilton, MD 12/08/23 [phone]

## 2023-12-07 NOTE — ED Provider Notes (Signed)
  EMERGENCY DEPARTMENT AT [facility] Provider Note   CSN: [id number] Arrival date & time: 12/07/23  9685     Patient presents with: Urinary Retention and Leg Swelling   Tami Wells is a 33 y.o. female G2, P34, 4 days postpartum after the birth of her son at 24 weeks at 64.  Induced labor with spontaneous vaginal delivery, first-degree laceration to the perineum.  She presents today with new onset swelling of the legs hands and face that started this evening, noticed around 9 PM.  Additionally has not been able to urinate since 9 PM despite sensation that she needs to urinate.  Per chart review, delivered at [redacted]w[redacted]d; pregnancy is complicated by obesity, prediabetes, uterine fibroids, and GBS positive given ampicillin labor.  Additionally had gestational hypertension.  She is currently breast-feeding.   HPI     Prior to Admission medications   Medication Sig Start Date End Date Taking? Authorizing Provider  ibuprofen (ADVIL) 600 MG tablet Take 600 mg by mouth every 6 (six) hours as needed (pain). 12/04/23  Yes [provider]    Allergies: Patient has no known allergies.    Review of Systems  Cardiovascular:  Positive for leg swelling.       L calf pain  Genitourinary:  Positive for difficulty urinating.    Updated Vital Signs BP (!) 138/101   Pulse 75   Temp 97.8 F (36.6 C) (Oral)   Resp 16   SpO2 99%   Breastfeeding Yes   Physical Exam Vitals and nursing note reviewed. Exam conducted with a chaperone present (ED RN Maddie).  Constitutional:      Appearance: She is not ill-appearing or toxic-appearing.  HENT:     Head: Normocephalic and atraumatic.     Comments: Mild facial edema, per patient and her partner, new today.    Mouth/Throat:     Mouth: Mucous membranes are moist.     Pharynx: No oropharyngeal exudate or posterior oropharyngeal erythema.  Eyes:     General:        Right eye: No discharge.        Left eye: No  discharge.     Conjunctiva/sclera: Conjunctivae normal.  Cardiovascular:     Rate and Rhythm: Normal rate and regular rhythm.     Pulses: Normal pulses.     Heart sounds: Normal heart sounds. No murmur heard. Pulmonary:     Effort: Pulmonary effort is normal. No respiratory distress.     Breath sounds: Normal breath sounds. No wheezing or rales.  Abdominal:     General: Bowel sounds are normal. There is no distension.     Palpations: Abdomen is soft.     Tenderness: There is no abdominal tenderness. There is no right CVA tenderness, left CVA tenderness, guarding or rebound.  Genitourinary:     Comments: Scant BRB per vagina, expected immediately postpartum. No vulvar edema.  Musculoskeletal:        General: No deformity.     Cervical back: Neck supple.     Right lower leg: 1+ Edema present.     Left lower leg: 1+ Edema (L posterior calf TTP, symmetric diameter to the R, no erythema) present.  Skin:    General: Skin is warm and dry.     Capillary Refill: Capillary refill takes less than 2 seconds.  Neurological:     General: No focal deficit present.     Mental Status: She is alert and oriented to person, place, and  time. Mental status is at baseline.  Psychiatric:        Mood and Affect: Mood normal.     (all labs ordered are listed, but only abnormal results are displayed) Labs Reviewed  URINALYSIS, ROUTINE W REFLEX MICROSCOPIC  CBC WITH DIFFERENTIAL/PLATELET  COMPREHENSIVE METABOLIC PANEL WITH GFR    EKG: None  Radiology: No results found.   Procedures   Medications Ordered in the ED - No data to display  Clinical Course as of 12/07/23 0646  [facility] Dec 07, 2023  0617 Consult to Dr. Abigail, OBGYN on call who recommends obtaining urine protein/Cr ratio. Can discharge if Urine protein/cr: <0.30. Recommends initiating lasix 20 mg daily x 5 days, and oral K 20 meq daily x 5 days. Follow up with OB on Monday for BP check.  [RS]  0621 I personally called lab and they will  be adding on urine protein creatinine ratio. [RS]    Clinical Course User Index [RS] Bobette Pleasant SAUNDERS, PA-C                                 Medical Decision Making 33 year old female presents with concern for urinary retention, peripheral swelling, and left calf pain that started today, 40s postpartum.  Hypertensive with pressures of 138/101 at time of arrival.  Vital signs otherwise reassuring.  Cardiopulmonary abdominal exams are benign.  Patient does have nonpitting peripheral edema in the arms and legs, 1-2+.  Left calf tenderness palpation without erythema or asymmetry from the right calf.  Will workup for evaluation of possible postpartum preeclampsia, HELLP syndrome.  Amount and/or Complexity of Data Reviewed Labs: ordered.    Details: CBC with no leukocytosis, normal platelets at 262.  CMP with normal creatinine of 0.7, normal LFTs and bilirubin.  UA with 30 mg/dL of protein, urine creatinine ratio pending at time shift change.     Recommendations from OB/GYN as above in ED course.  If urine protein creatinine ratio less than 0.3 patient be discharged home with follow-up on Monday at her OB for blood pressure check.  Dr. Abigail also recommends the above listed medications as initiation for management of peripheral edema.  At time of shift change patient pending urine creatinine/protein ratio as well as DVT study of the left leg. Care of this patient signed out to oncoming ED provider C. Aberman, PA-C at time of shift change. All pertinent HPI, physical exam, and laboratory findings were discussed with them prior to my departure. Disposition of patient pending completion of workup, reevaluation, and clinical judgement of oncoming ED provider.   This chart was dictated using voice recognition software, Dragon. Despite the best efforts of this provider to proofread and correct errors, errors may still occur which can change documentation meaning.      Final diagnoses:  None     ED Discharge Orders     None          Bobette Pleasant SAUNDERS, PA-C 12/07/23 9350    Theadore Ozell HERO, MD 12/07/23 [phone]

## 2023-12-07 NOTE — ED Triage Notes (Signed)
 Pt presents via POV c/o urinary retention and swelling in lower extremities and upper extremities. Denies SOB. Reports post-partum x4 days. Report delivered vaginally. Reports possible pre-eclampsia during delivery.  A&O x4.

## 2023-12-07 NOTE — Discharge Instructions (Signed)
 It was a pleasure taking care of you today.  As discussed, your workup was reassuring.  I am sending you home with 2 medications.  Take for the next 5 days.  Please call your OB tomorrow to schedule an appointment to be seen tomorrow for a BP recheck.  Return to the ER for severe headache, changes to vision, chest pain, shortness of breath, or any concerning symptoms.
